# Patient Record
Sex: Male | Born: 2000 | Race: Black or African American | Hispanic: No | Marital: Single | Smoking: Former smoker
Health system: Southern US, Community
[De-identification: ages and names within clinical notes are randomized; demographics above are authoritative.]

## PROBLEM LIST (undated history)

## (undated) DIAGNOSIS — L237 Allergic contact dermatitis due to plants, except food: Principal | ICD-10-CM

## (undated) DIAGNOSIS — H539 Unspecified visual disturbance: Secondary | ICD-10-CM

## (undated) HISTORY — DX: Unspecified visual disturbance: H53.9

## (undated) HISTORY — DX: Allergic contact dermatitis due to plants, except food: L23.7

---
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
## 2022-10-20 ENCOUNTER — Encounter (HOSPITAL_COMMUNITY): Payer: Self-pay

## 2022-10-20 ENCOUNTER — Emergency Department (HOSPITAL_COMMUNITY)
Admission: EM | Admit: 2022-10-20 | Discharge: 2022-10-20 | Disposition: A | Discharge status: Home / Self Care | Payer: Medicaid Other | Attending: Emergency Medicine | Admitting: Emergency Medicine

## 2022-10-20 ENCOUNTER — Other Ambulatory Visit: Payer: Self-pay

## 2022-10-20 ENCOUNTER — Emergency Department (HOSPITAL_COMMUNITY): Payer: Medicaid Other

## 2022-10-20 DIAGNOSIS — N3000 Acute cystitis without hematuria: Secondary | ICD-10-CM | POA: Insufficient documentation

## 2022-10-20 DIAGNOSIS — N50811 Right testicular pain: Secondary | ICD-10-CM | POA: Insufficient documentation

## 2022-10-20 LAB — URINALYSIS, ROUTINE W REFLEX MICROSCOPIC
Bilirubin Urine: NEGATIVE
Glucose, UA: NEGATIVE mg/dL
Hgb urine dipstick: NEGATIVE
Ketones, ur: NEGATIVE mg/dL
Nitrite: NEGATIVE
Protein, ur: NEGATIVE mg/dL
Specific Gravity, Urine: 1.01 (ref 1.005–1.030)
pH: 7.5 (ref 5.0–8.0)

## 2022-10-20 LAB — URINALYSIS, MICROSCOPIC (REFLEX)

## 2022-10-20 MED ORDER — DOXYCYCLINE HYCLATE 100 MG PO TABS: 100.0000 mg | ORAL_TABLET | Freq: Two times a day (BID) | ORAL | 0 refills | Status: AC

## 2022-10-20 MED ORDER — CEFTRIAXONE SODIUM 1 G IJ SOLR
500.0000 mg | Freq: Once | INTRAMUSCULAR | Status: AC
  Administered 2022-10-20: 500 mg via INTRAMUSCULAR
  Filled 2022-10-20: qty 10

## 2022-10-20 MED ORDER — LIDOCAINE HCL (PF) 1 % IJ SOLN
1.0000 mL | Freq: Once | INTRAMUSCULAR | Status: AC
  Administered 2022-10-20: 2.1 mL
  Filled 2022-10-20: qty 30

## 2022-10-20 NOTE — Discharge Instructions (Addendum)
You are being treated for an STD.  You will be notified if the testing comes back positive.

## 2022-10-20 NOTE — ED Provider Notes (Signed)
[facility]-EMERGENCY DEPT Provider Note   CSN: [id number] Arrival date & time: 10/20/22  5361     History  Chief Complaint  Patient presents with   Testicle Pain    Joseph Holder is a 21 y.o. male.   Testicle Pain  Patient presents with right testicle pain.  States he woke up this morning.  Had been severe.  States it went down his right leg also.  Has had enlarged veins in his left testicle before.  No trauma.  No dysuria.  States he is somewhat worried about STDs however.  No trauma.  Pain is feeling somewhat better after having the ultrasound.    Past Medical History:  Diagnosis Date   Poison ivy dermatitis 03/18/2013   Vision abnormalities     Home Medications Prior to Admission medications   Medication Sig Start Date End Date Taking? Authorizing Provider  doxycycline (VIBRA-TABS) 100 MG tablet Take 1 tablet (100 mg total) by mouth 2 (two) times daily for 7 days. 10/20/22 10/27/22 Yes Benjiman Core, MD  hydrocortisone 2.5 % ointment Apply topically 3 (three) times daily. To face 03/18/13   Ardyth Gal, MD  Multiple Vitamin (MULTIVITAMIN) tablet Take 1 tablet by mouth daily. Children's vitamin    [provider]  triamcinolone ointment (KENALOG) 0.5 % Apply topically 3 (three) times daily. To body 03/18/13   Ardyth Gal, MD      Allergies    Patient has no known allergies.    Review of Systems   Review of Systems  Genitourinary:  Positive for testicular pain.    Physical Exam Updated Vital Signs BP (!) 112/94   Pulse 78   Temp 98 F (36.7 C) (Oral)   Resp 18   Ht 5\' 10"  (1.778 m)   Wt 63.5 kg   SpO2 100%   BMI 20.09 kg/m  Physical Exam Vitals and nursing note reviewed.  Cardiovascular:     Rate and Rhythm: Regular rhythm.  Abdominal:     Tenderness: There is no abdominal tenderness.  Genitourinary:    Comments: Mild right-sided testicular tenderness.  No swelling.  Normal lie.  No penile  discharge. Musculoskeletal:     Cervical back: Neck supple.  Skin:    General: Skin is warm.  Neurological:     Mental Status: He is alert and oriented to person, place, and time.     ED Results / Procedures / Treatments   Labs (all labs ordered are listed, but only abnormal results are displayed) Labs Reviewed  URINALYSIS, ROUTINE W REFLEX MICROSCOPIC - Abnormal; Notable for the following components:      Result Value   Leukocytes,Ua TRACE (*)    All other components within normal limits  URINALYSIS, MICROSCOPIC (REFLEX) - Abnormal; Notable for the following components:   Bacteria, UA RARE (*)    All other components within normal limits  URINE CULTURE  RPR  HIV ANTIBODY (ROUTINE TESTING W REFLEX)  GC/CHLAMYDIA PROBE AMP ([place]) NOT AT [facility]    EKG None  Radiology [facility] SCROTUM W/DOPPLER  Result Date: 10/20/2022 CLINICAL DATA:  1 day history of right testicle pain. EXAM: SCROTAL ULTRASOUND DOPPLER ULTRASOUND OF THE TESTICLES TECHNIQUE: Complete ultrasound examination of the testicles, epididymis, and other scrotal structures was performed. Color and spectral Doppler ultrasound were also utilized to evaluate blood flow to the testicles. COMPARISON:  None Available. FINDINGS: Right testicle Measurements: 4.2 x 2.4 x 3.2 cm. No mass or microlithiasis visualized. Left testicle Measurements: 4.1 x 2.2  x 3.3 cm. No mass or microlithiasis visualized. Right epididymis:  Normal in size and appearance. Left epididymis:  Normal in size and appearance. Hydrocele:  None visualized. Varicocele:  None visualized. Pulsed Doppler interrogation of both testes demonstrates normal low resistance arterial and venous waveforms bilaterally. IMPRESSION: Unremarkable scrotal ultrasound. No evidence for testicular torsion or mass. No increased flow signal on color Doppler imaging to suggest epididymal orchitis. Electronically Signed   By: Kennith Center M.D.   On: 10/20/2022 07:37     Procedures Procedures    Medications Ordered in ED Medications  cefTRIAXone (ROCEPHIN) injection 500 mg (has no administration in time range)  lidocaine (PF) (XYLOCAINE) 1 % injection 1-2.1 mL (has no administration in time range)    ED Course/ Medical Decision Making/ A&P                           Medical Decision Making Amount and/or Complexity of Data Reviewed Labs: ordered.  Risk Prescription drug management.   Patient notes right testicular pain.  Somewhat acute.  Differential diagnosis includes orchitis, torsion, testicular pain, STD.  Ultrasound done and reassuring.  No clear cause of the pain.  Will get urinalysis and STD testing.  Has had previous reported "swollen veins" on the contralateral side.  Urine shows potential infection.  Will treat presumptively for possible STD.  Urine culture also sent.  STD testing sent.  Will give doxycycline and Rocephin.  Outpatient follow-up as needed.  Discharge home.        Final Clinical Impression(s) / ED Diagnoses Final diagnoses:  Pain in right testicle  Acute cystitis without hematuria    Rx / DC Orders ED Discharge Orders          Ordered    doxycycline (VIBRA-TABS) 100 MG tablet  2 times daily        10/20/22 1221              Benjiman Core, MD 10/20/22 1248

## 2022-10-20 NOTE — ED Provider Triage Note (Signed)
Emergency Medicine Provider Triage Evaluation Note  Joseph Holder , a 21 y.o. male  was evaluated in triage.  Pt complains of right testicle pain.  Patient reports right-sided testicle pain after awakening this morning.  States he has had similar symptoms with left testicle and diagnosed with "enlarged veins."  He noticed some enlargement of his right testicle with associated pain.  States that after ultrasound, currently no pain.  Reports intermittent dysuria since July and is somewhat concerned for STI/STD.  Denies fever, chills, night sweats, abdominal pain, nausea, vomiting, change in bowel habits.  Review of Systems  Positive: See above Negative:   Physical Exam  BP (!) 129/93 (BP Location: Left Arm)   Pulse (!) 105   Temp 98.2 F (36.8 C) (Oral)   Resp 18   Ht 5\' 10"  (1.778 m)   Wt 63.5 kg   SpO2 100%   BMI 20.09 kg/m  Gen:   Awake, no distress   Resp:  Normal effort  MSK:   Moves extremities without difficulty  Other:  Nontender to palpation bilateral testicles.  Cremasteric reflex intact bilaterally  Medical Decision Making  Medically screening exam initiated at 7:37 AM.  Appropriate orders placed.  Joseph Holder was informed that the remainder of the evaluation will be completed by another provider, this initial triage assessment does not replace that evaluation, and the importance of remaining in the ED until their evaluation is complete.     Leodis Sias, Peter Garter 10/20/22 [phone]

## 2022-10-20 NOTE — ED Triage Notes (Signed)
Patient c/o right testicle pain since this AM when he woke. Patient states he left testicle is swollen.

## 2022-10-21 LAB — GC/CHLAMYDIA PROBE AMP (~~LOC~~) NOT AT ARMC
Chlamydia: POSITIVE — AB
Comment: NEGATIVE
Comment: NORMAL
Neisseria Gonorrhea: POSITIVE — AB

## 2022-10-21 LAB — URINE CULTURE: Culture: NO GROWTH
